# Patient Record
Sex: Male | Born: 1995 | Hispanic: No | Marital: Married | Smoking: Current every day smoker
Health system: Southern US, Community
[De-identification: ages and names within clinical notes are randomized; demographics above are authoritative.]

---
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
## 2015-06-16 ENCOUNTER — Other Ambulatory Visit: Payer: Self-pay | Admitting: Orthopedic Surgery

## 2015-06-16 ENCOUNTER — Encounter (HOSPITAL_BASED_OUTPATIENT_CLINIC_OR_DEPARTMENT_OTHER): Payer: Self-pay | Admitting: *Deleted

## 2015-06-21 ENCOUNTER — Ambulatory Visit (HOSPITAL_BASED_OUTPATIENT_CLINIC_OR_DEPARTMENT_OTHER): Admission: RE | Admit: 2015-06-21 | Payer: Medicaid Other | Source: Ambulatory Visit | Admitting: Orthopedic Surgery

## 2015-06-21 SURGERY — CYST REMOVAL
Anesthesia: Monitor Anesthesia Care | Site: Hand | Laterality: Right

## 2017-05-03 ENCOUNTER — Ambulatory Visit: Payer: Medicaid Other | Admitting: Podiatry

## 2017-05-25 ENCOUNTER — Ambulatory Visit: Payer: Medicaid Other | Admitting: Podiatry

## 2018-10-07 ENCOUNTER — Encounter (HOSPITAL_COMMUNITY): Payer: Self-pay

## 2018-10-07 ENCOUNTER — Emergency Department (HOSPITAL_COMMUNITY): Payer: No Typology Code available for payment source

## 2018-10-07 ENCOUNTER — Emergency Department (HOSPITAL_COMMUNITY)
Admission: EM | Admit: 2018-10-07 | Discharge: 2018-10-07 | Disposition: A | Discharge status: Home / Self Care | Payer: No Typology Code available for payment source | Attending: Emergency Medicine | Admitting: Emergency Medicine

## 2018-10-07 DIAGNOSIS — M545 Low back pain, unspecified: Secondary | ICD-10-CM

## 2018-10-07 DIAGNOSIS — Y9389 Activity, other specified: Secondary | ICD-10-CM | POA: Insufficient documentation

## 2018-10-07 DIAGNOSIS — R079 Chest pain, unspecified: Secondary | ICD-10-CM | POA: Diagnosis not present

## 2018-10-07 DIAGNOSIS — S0990XA Unspecified injury of head, initial encounter: Secondary | ICD-10-CM | POA: Diagnosis present

## 2018-10-07 DIAGNOSIS — S060X0A Concussion without loss of consciousness, initial encounter: Secondary | ICD-10-CM | POA: Diagnosis not present

## 2018-10-07 DIAGNOSIS — Y92410 Unspecified street and highway as the place of occurrence of the external cause: Secondary | ICD-10-CM | POA: Insufficient documentation

## 2018-10-07 DIAGNOSIS — M542 Cervicalgia: Secondary | ICD-10-CM | POA: Diagnosis not present

## 2018-10-07 DIAGNOSIS — M79601 Pain in right arm: Secondary | ICD-10-CM | POA: Insufficient documentation

## 2018-10-07 DIAGNOSIS — Y999 Unspecified external cause status: Secondary | ICD-10-CM | POA: Diagnosis not present

## 2018-10-07 DIAGNOSIS — F17290 Nicotine dependence, other tobacco product, uncomplicated: Secondary | ICD-10-CM | POA: Insufficient documentation

## 2018-10-07 MED ORDER — OXYCODONE-ACETAMINOPHEN 5-325 MG PO TABS
1.0000 | ORAL_TABLET | Freq: Once | ORAL | Status: AC
  Administered 2018-10-07: 1 via ORAL
  Filled 2018-10-07: qty 1

## 2018-10-07 MED ORDER — CYCLOBENZAPRINE HCL 10 MG PO TABS
5.0000 mg | ORAL_TABLET | Freq: Once | ORAL | Status: AC
  Administered 2018-10-07: 5 mg via ORAL
  Filled 2018-10-07: qty 1

## 2018-10-07 MED ORDER — METHOCARBAMOL 750 MG PO TABS: 750.0000 mg | ORAL_TABLET | Freq: Three times a day (TID) | ORAL | 0 refills | Status: DC | PRN
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
## 2018-10-07 NOTE — Discharge Instructions (Signed)
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
## 2020-02-18 ENCOUNTER — Other Ambulatory Visit: Payer: Self-pay

## 2020-02-18 ENCOUNTER — Ambulatory Visit (INDEPENDENT_AMBULATORY_CARE_PROVIDER_SITE_OTHER): Payer: Self-pay

## 2020-02-18 ENCOUNTER — Encounter (HOSPITAL_COMMUNITY): Payer: Self-pay

## 2020-02-18 ENCOUNTER — Ambulatory Visit (HOSPITAL_COMMUNITY)
Admission: EM | Admit: 2020-02-18 | Discharge: 2020-02-18 | Disposition: A | Discharge status: Home / Self Care | Payer: Self-pay

## 2020-02-18 DIAGNOSIS — R079 Chest pain, unspecified: Secondary | ICD-10-CM

## 2020-02-18 NOTE — ED Triage Notes (Signed)
Pt c/o 10/10 sharp non radiating left sided chest pain. Pt states it woke him up out of his sleep at 0300 today. Pt denies SOB, N/V. Pt has non labored breathing.

## 2020-02-18 NOTE — Discharge Instructions (Addendum)
This is most likely musculoskeletal chest pain. Your EKG was normal Your x-ray was normal Try doing some stretching, 600 ibuprofen every 8 hours for pain as needed Follow up as needed for continued or worsening symptoms

## 2020-02-20 NOTE — ED Provider Notes (Signed)
[facility]    CSN: [id number] Arrival date & time: 02/18/20  1134      History   Chief Complaint Chief Complaint  Patient presents with  . Chest Pain    HPI Gilbert Gilbert is a 24 y.o. male.   Patient is a 24 year old male presents today with left-sided chest pain.  Reporting woke him out of his sleep at 3:00 this a.m.  Symptoms have been intermittent since then.  Symptoms worse with palpation of the chest or certain movements.  No change with breathing.  No cough, chest congestion, fever, chills, body aches.  Denies any shortness of breath.  Denies any specific strenuous heavy lifting or injuries.  Describes the pain as sharp and stabbing when it comes.  No history of DVT or PE.  No recent traveling, surgery or prolonged sitting.  He does not smoke cigarettes but uses a vape.  ROS per HPI      History reviewed. No pertinent past medical history.  There are no problems to display for this patient.   History reviewed. No pertinent surgical history.     Home Medications    Prior to Admission medications   Medication Sig Start Date End Date Taking? Authorizing Provider  Multiple Vitamin (MULTIVITAMIN WITH MINERALS) TABS tablet Take 1 tablet by mouth daily.   Yes [provider]  Omega-3 Fatty Acids (FISH OIL) 1000 MG CAPS Take by mouth.   Yes [provider]    Family History Family History  Problem Relation Age of Onset  . Diabetes Mother   . Hypertension Mother   . Hypertension Father     Social History Social History   Tobacco Use  . Smoking status: Current Every Day Smoker  . Smokeless tobacco: Never Used  . Tobacco comment: Smokes Hookah   Substance Use Topics  . Alcohol use: No  . Drug use: No     Allergies   Patient has no known allergies.   Review of Systems Review of Systems   Physical Exam Triage Vital Signs ED Triage Vitals  Enc Vitals Group     BP 02/18/20 1150 117/75     Pulse Rate 02/18/20 1150 70    Resp 02/18/20 1150 16     Temp 02/18/20 1150 (!) 97.5 F (36.4 C)     Temp Source 02/18/20 1150 Oral     SpO2 02/18/20 1150 97 %     Weight 02/18/20 1151 216 lb (98 kg)     Height 02/18/20 1151 6\' 2"  (1.88 m)     Head Circumference --      Peak Flow --      Pain Score 02/18/20 1151 10     Pain Loc --      Pain Edu? --      Excl. in GC? --    No data found.  Updated Vital Signs BP 117/75   Pulse 70   Temp (!) 97.5 F (36.4 C) (Oral)   Resp 16   Ht 6\' 2"  (1.88 m)   Wt 216 lb (98 kg)   SpO2 97%   BMI 27.73 kg/m   Visual Acuity Right Eye Distance:   Left Eye Distance:   Bilateral Distance:    Right Eye Near:   Left Eye Near:    Bilateral Near:     Physical Exam Vitals and nursing note reviewed.  Constitutional:      General: He is not in acute distress.    Appearance: Normal appearance. He  is not ill-appearing, toxic-appearing or diaphoretic.  HENT:     Head: Normocephalic and atraumatic.     Nose: Nose normal.  Eyes:     Conjunctiva/sclera: Conjunctivae normal.  Cardiovascular:     Rate and Rhythm: Normal rate and regular rhythm.  Pulmonary:     Effort: Pulmonary effort is normal.     Breath sounds: Normal breath sounds.  Abdominal:     Palpations: Abdomen is soft.     Tenderness: There is no abdominal tenderness.  Musculoskeletal:        General: Normal range of motion.     Cervical back: Normal range of motion.  Skin:    General: Skin is warm and dry.  Neurological:     Mental Status: He is alert.  Psychiatric:        Mood and Affect: Mood normal.      UC Treatments / Results  Labs (all labs ordered are listed, but only abnormal results are displayed) Labs Reviewed - No data to display  EKG   Radiology No results found.  Procedures Procedures (including critical care time)  Medications Ordered in UC Medications - No data to display  Initial Impression / Assessment and Plan / UC Course  I have reviewed the triage vital signs and the  nursing notes.  Pertinent labs & imaging results that were available during my care of the patient were reviewed by me and considered in my medical decision making (see chart for details).     Chest pain-EKG with normal sinus rhythm and normal rate.  X-ray without any acute findings. We will treat this as musculoskeletal chest wall pain. Recommended doing stretching, 600 of ibuprofen every 8 hours Return precautions and ER precautions given. Final Clinical Impressions(s) / UC Diagnoses   Final diagnoses:  Chest pain, unspecified type     Discharge Instructions     This is most likely musculoskeletal chest pain. Your EKG was normal Your x-ray was normal Try doing some stretching, 600 ibuprofen every 8 hours for pain as needed Follow up as needed for continued or worsening symptoms     ED Prescriptions    None     PDMP not reviewed this encounter.   Loura Halt A, NP 02/20/20 1306

## 2021-11-09 ENCOUNTER — Ambulatory Visit: Payer: Medicaid Other | Admitting: Nurse Practitioner

## 2022-04-06 ENCOUNTER — Ambulatory Visit (INDEPENDENT_AMBULATORY_CARE_PROVIDER_SITE_OTHER): Payer: Self-pay | Admitting: Podiatry

## 2022-04-06 ENCOUNTER — Ambulatory Visit (INDEPENDENT_AMBULATORY_CARE_PROVIDER_SITE_OTHER): Payer: Self-pay

## 2022-04-06 ENCOUNTER — Encounter: Payer: Self-pay | Admitting: Podiatry

## 2022-04-06 DIAGNOSIS — M79671 Pain in right foot: Secondary | ICD-10-CM

## 2022-04-06 DIAGNOSIS — M76821 Posterior tibial tendinitis, right leg: Secondary | ICD-10-CM

## 2022-04-06 MED ORDER — MELOXICAM 15 MG PO TABS: 15.0000 mg | ORAL_TABLET | Freq: Every day | ORAL | 0 refills | Status: AC

## 2022-04-06 NOTE — Patient Instructions (Signed)
Posterior Tibial Tendinitis Rehab Ask your health care provider which exercises are safe for you. Do exercises exactly as told by your health care provider and adjust them as directed. It is normal to feel mild stretching, pulling, tightness, or discomfort as you do these exercises. Stop right away if you feel sudden pain or your pain gets worse. Do not begin these exercises until told by your health care provider. Stretching and range-of-motion exercises These exercises warm up your muscles and joints and improve the movement and flexibility in your ankle and foot. These exercises may also help to relieve pain. Standing wall calf stretch, knee straight  Stand with your hands against a wall. Extend your left / right leg behind you, and bend your front knee slightly. If directed, place a folded washcloth under the arch of your foot for support. Point the toes of your back foot slightly inward. Keeping your heels on the floor and your back knee straight, shift your weight toward the wall. Do not allow your back to arch. You should feel a gentle stretch in your upper left / right calf. Hold this position for __________ seconds. Repeat __________ times. Complete this exercise __________ times a day. Standing wall calf stretch, knee bent Stand with your hands against a wall. Extend your left / right leg behind you, and bend your front knee slightly. If directed, place a folded washcloth under the arch of your foot for support. Point the toes of your back foot slightly inward. Unlock your back knee so it is bent. Keep your heels on the floor. You should feel a gentle stretch deep in your lower left / right calf. Hold this position for __________ seconds. Repeat __________ times. Complete this exercise __________ times a day. Strengthening exercises These exercises build strength and endurance in your ankle and foot. Endurance is the ability to use your muscles for a long time, even after they get  tired. Ankle inversion with band Secure one end of a rubber exercise band or tubing to a fixed object, such as a table leg or a pole, that will stay still when the band is pulled. Loop the other end of the band around the middle of your left / right foot. Sit on the floor facing the object with your left / right leg extended. The band or tube should be slightly tense when your foot is relaxed. Leading with your big toe, slowly bring your left / right foot and ankle inward, toward your other foot (inversion). Hold this position for __________ seconds. Slowly return your foot to the starting position. Repeat __________ times. Complete this exercise __________ times a day. Towel curls  Sit in a chair on a non-carpeted surface, and put your feet on the floor. Place a towel in front of your feet. If told by your health care provider, add a __________ weight to the end of the towel. Keeping your heel on the floor, put your left / right foot on the towel. Pull the towel toward you by grabbing the towel with your toes and curling them under. Keep your heel on the floor while you do this. Let your toes relax. Grab the towel with your toes again. Keep going until the towel is completely underneath your foot. Repeat __________ times. Complete this exercise __________ times a day. Balance exercise This exercise improves or maintains your balance. Balance is important in preventing falls. Single leg stand Without wearing shoes, stand near a railing or in a doorway. You may hold   on to the railing or door frame as needed for balance. Stand on your left / right foot. Keep your big toe down on the floor and try to keep your arch lifted. If balancing in this position is too easy, try the exercise with your eyes closed or while standing on a pillow. Hold this position for __________ seconds. Repeat __________ times. Complete this exercise __________ times a day. This information is not intended to replace  advice given to you by your health care provider. Make sure you discuss any questions you have with your health care provider. Document Revised: 02/25/2019 Document Reviewed: 01/01/2019 Elsevier Patient Education  2023 [employer].  

## 2022-04-06 NOTE — Progress Notes (Signed)
  Subjective:  Patient ID: Gilbert Gilbert, male    DOB: [birth year],   MRN: [id number]  Chief Complaint  Patient presents with   Foot Pain                                                                                                                                                                                                                                                            26 y.o. male presents for concern of right foot pain that has been going on for four weeks. Relates it hurts on the left side of his right foot and goes up into the ankle. Relates   shooting pain when walking. Relates resting is the only thing that has helped. Denies any other pedal complaints. Denies n/v/f/c.   No past medical history on file.  Objective:  Physical Exam: Vascular: DP/PT pulses 2/4 bilateral. CFT <3 seconds. Normal hair growth on digits. No edema.  Skin. No lacerations or abrasions bilateral feet.  Musculoskeletal: MMT 5/5 bilateral lower extremities in DF, PF, Inversion and Eversion. Deceased ROM in DF of ankle joint. Tender along insertion of PT tendon and proximally to medial malleolus. No pain around achilles or medial calcaneal tubercle. Pain with inversion and plantarflexion of the foot. Unable to do single heel rise.  Neurological: Sensation intact to light touch.   Assessment:   1. Posterior tibial tendon dysfunction (PTTD) of right lower extremity      Plan:  Patient was evaluated and treated and all questions answered. X-rays reviewed and discussed with patient. No acute fractures or dislocations noted.   Discussed PTTD diagnosis and treatment options with patient. Stretching exercises discussed and handout dispensed. Prescription for meloxicam provided. Tri-Lock ankle brace dispensed. Discussed if there is no improvement PT/MRI/injection may be an option. Patient to return to clinic in 6 to 8 weeks or sooner if symptoms fail to improve or worsen.   Louann Sjogren, DPM

## 2022-05-25 ENCOUNTER — Ambulatory Visit (INDEPENDENT_AMBULATORY_CARE_PROVIDER_SITE_OTHER): Payer: Self-pay | Admitting: Podiatry

## 2022-05-25 DIAGNOSIS — Z91199 Patient's noncompliance with other medical treatment and regimen due to unspecified reason: Secondary | ICD-10-CM

## 2022-05-25 NOTE — Progress Notes (Signed)
No show

## 2022-10-13 DIAGNOSIS — Z419 Encounter for procedure for purposes other than remedying health state, unspecified: Secondary | ICD-10-CM | POA: Diagnosis not present

## 2022-11-13 DIAGNOSIS — Z419 Encounter for procedure for purposes other than remedying health state, unspecified: Secondary | ICD-10-CM | POA: Diagnosis not present

## 2022-12-14 DIAGNOSIS — Z419 Encounter for procedure for purposes other than remedying health state, unspecified: Secondary | ICD-10-CM | POA: Diagnosis not present

## 2023-01-12 DIAGNOSIS — Z419 Encounter for procedure for purposes other than remedying health state, unspecified: Secondary | ICD-10-CM | POA: Diagnosis not present

## 2023-02-12 DIAGNOSIS — Z419 Encounter for procedure for purposes other than remedying health state, unspecified: Secondary | ICD-10-CM | POA: Diagnosis not present

## 2023-03-14 DIAGNOSIS — Z419 Encounter for procedure for purposes other than remedying health state, unspecified: Secondary | ICD-10-CM | POA: Diagnosis not present

## 2023-03-26 IMAGING — DX DG FOOT COMPLETE 3+V*R*
3 series · 3 of 3 positions shown · non-contrast
Comparison: None Available.

CLINICAL DATA: Foot pain.

EXAM:
RIGHT FOOT COMPLETE - 3+ VIEW

[foot ap wb]
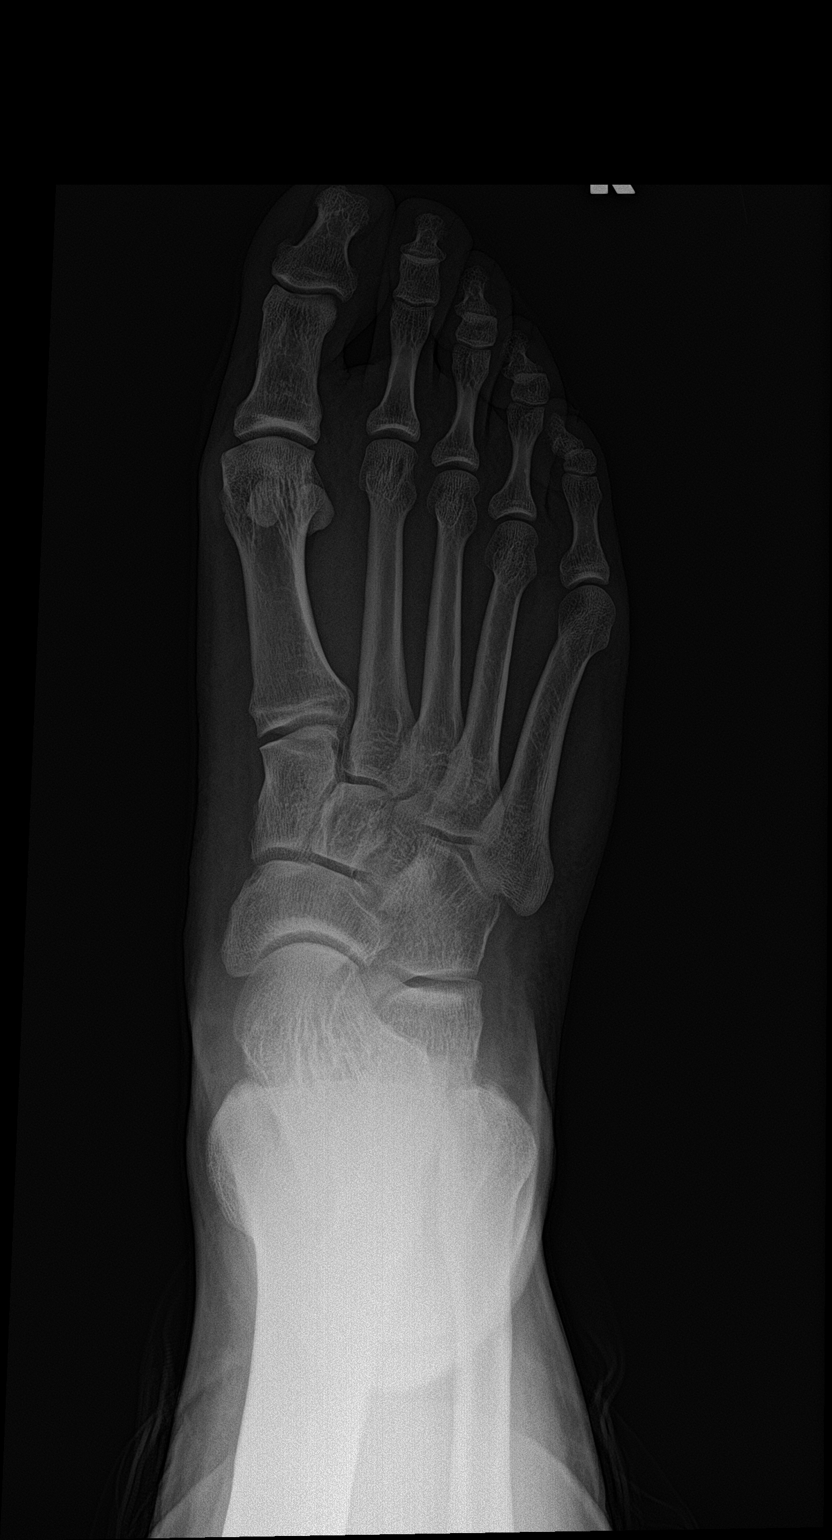

[foot obl wb]
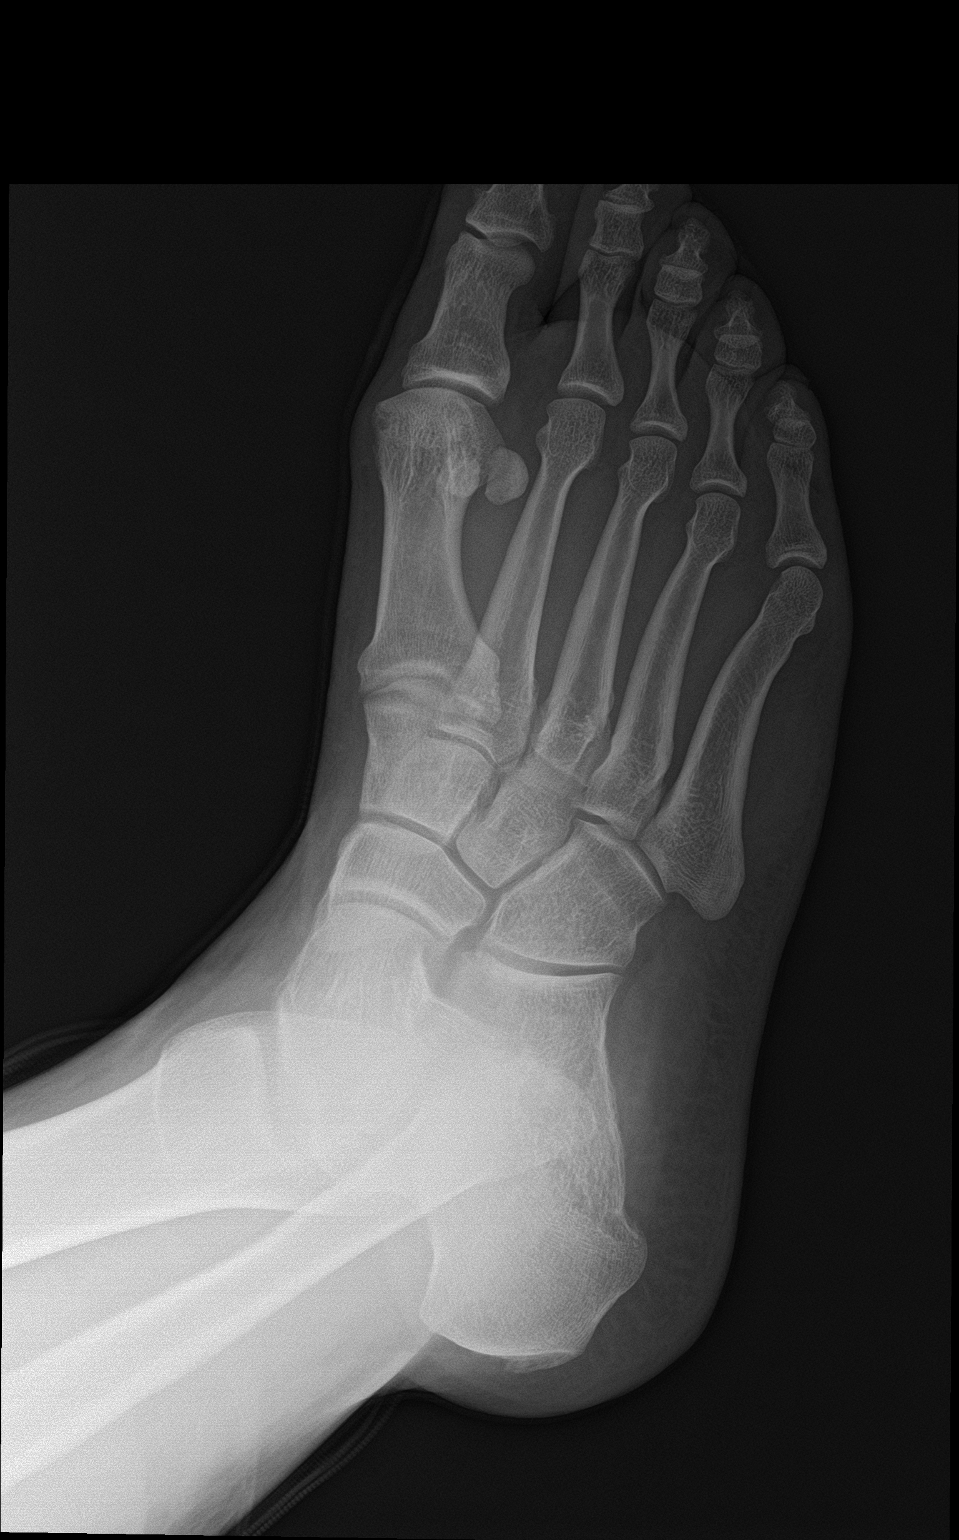

[foot lat wb]
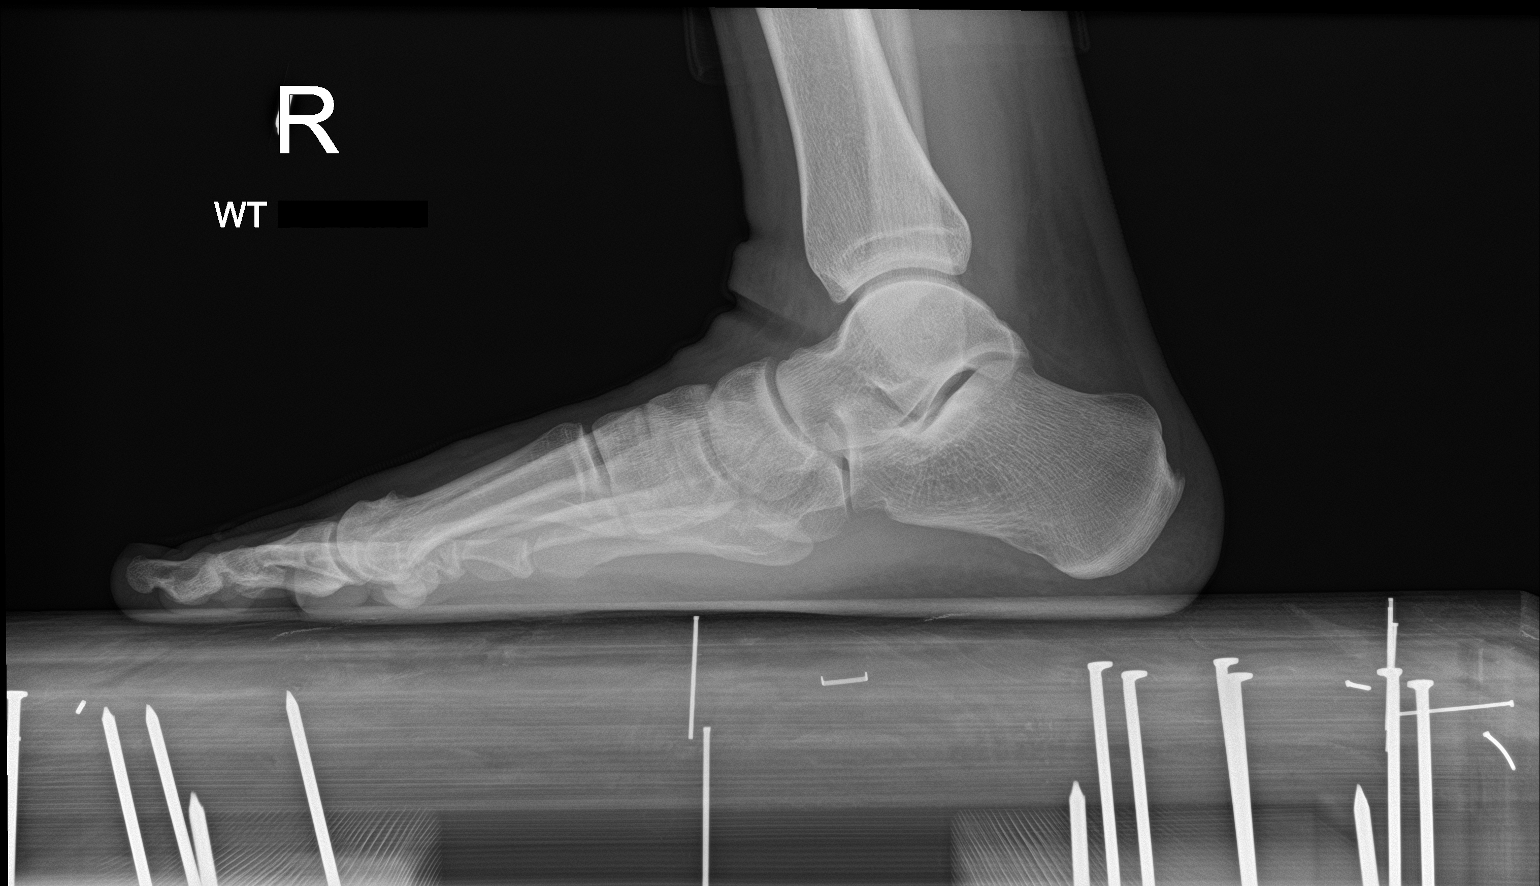

[3 of 3 positions shown; findings below may reference images not displayed]

FINDINGS: There is no evidence of fracture or dislocation. There is no
evidence of arthropathy or other focal bone abnormality. Soft
tissues are unremarkable.
IMPRESSION: Negative.

## 2023-04-14 DIAGNOSIS — Z419 Encounter for procedure for purposes other than remedying health state, unspecified: Secondary | ICD-10-CM | POA: Diagnosis not present

## 2023-05-14 DIAGNOSIS — Z419 Encounter for procedure for purposes other than remedying health state, unspecified: Secondary | ICD-10-CM | POA: Diagnosis not present

## 2023-06-14 DIAGNOSIS — Z419 Encounter for procedure for purposes other than remedying health state, unspecified: Secondary | ICD-10-CM | POA: Diagnosis not present

## 2023-07-15 DIAGNOSIS — Z419 Encounter for procedure for purposes other than remedying health state, unspecified: Secondary | ICD-10-CM | POA: Diagnosis not present

## 2023-08-14 DIAGNOSIS — Z419 Encounter for procedure for purposes other than remedying health state, unspecified: Secondary | ICD-10-CM | POA: Diagnosis not present

## 2023-09-14 DIAGNOSIS — Z419 Encounter for procedure for purposes other than remedying health state, unspecified: Secondary | ICD-10-CM | POA: Diagnosis not present

## 2023-10-14 DIAGNOSIS — Z419 Encounter for procedure for purposes other than remedying health state, unspecified: Secondary | ICD-10-CM | POA: Diagnosis not present

## 2023-11-04 ENCOUNTER — Encounter (HOSPITAL_COMMUNITY): Payer: Self-pay

## 2023-11-04 ENCOUNTER — Emergency Department (HOSPITAL_COMMUNITY): Payer: Medicaid Other

## 2023-11-04 ENCOUNTER — Emergency Department (HOSPITAL_COMMUNITY)
Admission: EM | Admit: 2023-11-04 | Discharge: 2023-11-04 | Disposition: A | Discharge status: Home / Self Care | Payer: Medicaid Other | Attending: Emergency Medicine | Admitting: Emergency Medicine

## 2023-11-04 ENCOUNTER — Other Ambulatory Visit: Payer: Self-pay

## 2023-11-04 DIAGNOSIS — R0602 Shortness of breath: Secondary | ICD-10-CM | POA: Diagnosis not present

## 2023-11-04 DIAGNOSIS — R0789 Other chest pain: Secondary | ICD-10-CM | POA: Insufficient documentation

## 2023-11-04 DIAGNOSIS — R002 Palpitations: Secondary | ICD-10-CM | POA: Diagnosis not present

## 2023-11-04 LAB — BASIC METABOLIC PANEL
Anion gap: 6 (ref 5–15)
BUN: 22 mg/dL — ABNORMAL HIGH (ref 6–20)
CO2: 24 mmol/L (ref 22–32)
Calcium: 8.7 mg/dL — ABNORMAL LOW (ref 8.9–10.3)
Chloride: 102 mmol/L (ref 98–111)
Creatinine, Ser: 0.86 mg/dL (ref 0.61–1.24)
GFR, Estimated: >60 mL/min (ref >60)
Glucose, Bld: 92 mg/dL (ref 70–99)
Potassium: 3.6 mmol/L (ref 3.5–5.1)
Sodium: 132 mmol/L — ABNORMAL LOW (ref 135–145)

## 2023-11-04 LAB — CBC
HCT: 44.6 % (ref 39.0–52.0)
Hemoglobin: 14.9 g/dL (ref 13.0–17.0)
MCH: 29.2 pg (ref 26.0–34.0)
MCHC: 33.4 g/dL (ref 30.0–36.0)
MCV: 87.5 fL (ref 80.0–100.0)
Platelets: 169 10*3/uL (ref 150–400)
RBC: 5.1 MIL/uL (ref 4.22–5.81)
RDW: 11.8 % (ref 11.5–15.5)
WBC: 6.7 10*3/uL (ref 4.0–10.5)
nRBC: 0 % (ref 0.0–0.2)

## 2023-11-04 LAB — TROPONIN I (HIGH SENSITIVITY): Troponin I (High Sensitivity): 4 ng/L (ref <18)

## 2023-11-04 NOTE — ED Provider Notes (Signed)
[place] [facility] AT [facility] Provider Note   CSN: [id number] Arrival date & time: 11/04/23  1604     History  Chief Complaint  Patient presents with   Shortness of Breath   Headache   Fatigue    Gilbert Gilbert is a 27 y.o. male.  HPI    27 year old male comes in with chief complaint of chest discomfort and shortness of breath.  He states that he had fallen asleep in his couch yesterday.  He woke up around 1:30 AM and went to the bathroom.  While brushing his teeth, he suddenly had severe shortness of breath and chest tightness that lasted about a minute.  He felt like his " soul was leaving him".  He had some palpitations. Thereafter he felt well and proceeded to go to bed.  After waking up this morning he is just felt drowsy and weak and he has experienced a little bit of a headache.  Currently he does not have any chest pain, shortness of breath or severe headache.  He denies any URI-like symptoms.  There is family history of CAD, but no premature deaths. Pt has no hx of PE, DVT and denies any exogenous hormone (testosterone / estrogen) use, long distance travels or surgery in the past 6 weeks, active cancer, recent immobilization. Patient denies any substance use disorder.  Home Medications Prior to Admission medications   Medication Sig Start Date End Date Taking? Authorizing Provider  meloxicam (MOBIC) 15 MG tablet Take 1 tablet (15 mg total) by mouth daily. 04/06/22   Louann Sjogren, DPM  Multiple Vitamin (MULTIVITAMIN WITH MINERALS) TABS tablet Take 1 tablet by mouth daily.    [provider]  Omega-3 Fatty Acids (FISH OIL) 1000 MG CAPS Take by mouth.    [provider]      Allergies    Patient has no known allergies.    Review of Systems   Review of Systems  All other systems reviewed and are negative.   Physical Exam Updated Vital Signs BP 128/83 (BP Location: Left Arm)   Pulse 65   Temp 98.3 F (36.8 C) (Oral)    Resp 17   Ht 6\' 2"  (1.88 m)   Wt 108.9 kg   SpO2 98%   BMI 30.81 kg/m  Physical Exam Vitals and nursing note reviewed.  Constitutional:      Appearance: He is well-developed.  HENT:     Head: Atraumatic.  Cardiovascular:     Rate and Rhythm: Normal rate.  Pulmonary:     Effort: Pulmonary effort is normal.     Breath sounds: No decreased breath sounds, wheezing, rhonchi or rales.  Musculoskeletal:     Cervical back: Neck supple.  Skin:    General: Skin is warm.  Neurological:     Mental Status: He is alert and oriented to person, place, and time.     ED Results / Procedures / Treatments   Labs (all labs ordered are listed, but only abnormal results are displayed) Labs Reviewed  BASIC METABOLIC PANEL - Abnormal; Notable for the following components:      Result Value   Sodium 132 (*)    BUN 22 (*)    Calcium 8.7 (*)    All other components within normal limits  CBC  TROPONIN I (HIGH SENSITIVITY)  TROPONIN I (HIGH SENSITIVITY)    EKG EKG Interpretation Date/Time:  Sunday November 04 2023 17:37:52 EST Ventricular Rate:  62 PR Interval:  290 QRS  Duration:  99 QT Interval:  430 QTC Calculation: 437 R Axis:   24  Text Interpretation: Sinus rhythm Prolonged PR interval nonspecific conduction irregularity noted No significant change since last tracing Confirmed by Derwood Kaplan [phone]) on 11/04/2023 6:23:13 PM  Radiology DG Chest 2 View Result Date: 11/04/2023 CLINICAL DATA:  Shortness of breath. EXAM: CHEST - 2 VIEW COMPARISON:  02/18/2020 FINDINGS: The lungs are clear without focal pneumonia, edema, pneumothorax or pleural effusion. The cardiopericardial silhouette is within normal limits for size. No acute bony abnormality. Telemetry leads overlie the chest. IMPRESSION: No active cardiopulmonary disease. Electronically Signed   By: Kennith Center M.D.   On: 11/04/2023 16:36    Procedures Procedures    Medications Ordered in ED Medications - No data to  display  ED Course/ Medical Decision Making/ A&P                                 Medical Decision Making Amount and/or Complexity of Data Reviewed Labs: ordered. Radiology: ordered.   This patient presents to the ED with chief complaint(s) of shortness of breath, chest tightness with no pertinent past medical history.The complaint involves an extensive differential diagnosis and also carries with it a high risk of complications and morbidity.    The differential diagnosis includes: PSVT, AF, PE, electrolyte abnormality  The initial plan is to get basic labs, tele monitoring and single troponin. No uri like symptoms.    Independent labs interpretation:  The following labs were independently interpreted: No evidence of anemia, severe electrolyte abnormality.  EKG also looks to be at baseline.  Independent visualization and interpretation of imaging: - I independently visualized the following imaging with scope of interpretation limited to determining acute life threatening conditions related to emergency care: X-ray of the chest, which revealed no evidence of pneumothorax  Treatment and Reassessment: Initial workup is reassuring.  Plan is to discharge patient with recommendation of journaling similar events and to return to the ER if he starts having severe chest pain, near fainting or fainting. Additionally, we have given him cardiology contact number.  If he has recurrent episodes of chest pain that are lasting just for few minutes along with palpitations and near fainting then he can call them for outpatient workup.   Final Clinical Impression(s) / ED Diagnoses Final diagnoses:  Chest tightness  Palpitations    Rx / DC Orders ED Discharge Orders     None         Derwood Kaplan, MD 11/04/23 1844

## 2023-11-04 NOTE — ED Triage Notes (Signed)
Pt arrives via POV. Pt reports last night around 0130 he felt a sudden onset of sob that last about 1 minute. He then went sleep. He states after waking up this morning he has had a headache and some fatigue. Pt currently denies cp or sob. Pt AxOx4. NAD

## 2023-11-04 NOTE — Discharge Instructions (Addendum)
We saw you in the ER for the chest pain/shortness of breath. All of our cardiac workup is normal, including labs, EKG and chest X-RAY are normal. We are not sure what is causing your discomfort, but we feel comfortable sending you home at this time. The workup in the ER is not complete, and you should follow up with your primary care doctor for further evaluation.  We have provided you with phone number for the cardiology group.  If you continue to have episodes of chest discomfort or near fainting or palpitations, please reach out to them.

## 2023-11-14 DIAGNOSIS — Z419 Encounter for procedure for purposes other than remedying health state, unspecified: Secondary | ICD-10-CM | POA: Diagnosis not present

## 2023-12-15 DIAGNOSIS — Z419 Encounter for procedure for purposes other than remedying health state, unspecified: Secondary | ICD-10-CM | POA: Diagnosis not present

## 2024-01-12 DIAGNOSIS — Z419 Encounter for procedure for purposes other than remedying health state, unspecified: Secondary | ICD-10-CM | POA: Diagnosis not present

## 2024-02-23 DIAGNOSIS — Z419 Encounter for procedure for purposes other than remedying health state, unspecified: Secondary | ICD-10-CM | POA: Diagnosis not present

## 2024-03-24 DIAGNOSIS — Z419 Encounter for procedure for purposes other than remedying health state, unspecified: Secondary | ICD-10-CM | POA: Diagnosis not present

## 2024-04-24 DIAGNOSIS — Z419 Encounter for procedure for purposes other than remedying health state, unspecified: Secondary | ICD-10-CM | POA: Diagnosis not present

## 2024-05-24 DIAGNOSIS — Z419 Encounter for procedure for purposes other than remedying health state, unspecified: Secondary | ICD-10-CM | POA: Diagnosis not present

## 2024-06-24 DIAGNOSIS — Z419 Encounter for procedure for purposes other than remedying health state, unspecified: Secondary | ICD-10-CM | POA: Diagnosis not present

## 2024-07-25 DIAGNOSIS — Z419 Encounter for procedure for purposes other than remedying health state, unspecified: Secondary | ICD-10-CM | POA: Diagnosis not present

## 2024-09-15 ENCOUNTER — Other Ambulatory Visit: Payer: Self-pay

## 2024-09-15 ENCOUNTER — Encounter (HOSPITAL_BASED_OUTPATIENT_CLINIC_OR_DEPARTMENT_OTHER): Payer: Self-pay | Admitting: Emergency Medicine

## 2024-09-15 ENCOUNTER — Emergency Department (HOSPITAL_BASED_OUTPATIENT_CLINIC_OR_DEPARTMENT_OTHER)
Admission: EM | Admit: 2024-09-15 | Discharge: 2024-09-15 | Disposition: A | Discharge status: Home / Self Care | Attending: Emergency Medicine | Admitting: Emergency Medicine

## 2024-09-15 DIAGNOSIS — Y990 Civilian activity done for income or pay: Secondary | ICD-10-CM | POA: Diagnosis not present

## 2024-09-15 DIAGNOSIS — R1032 Left lower quadrant pain: Secondary | ICD-10-CM | POA: Insufficient documentation

## 2024-09-15 DIAGNOSIS — X500XXA Overexertion from strenuous movement or load, initial encounter: Secondary | ICD-10-CM | POA: Diagnosis not present

## 2024-09-15 DIAGNOSIS — F17200 Nicotine dependence, unspecified, uncomplicated: Secondary | ICD-10-CM | POA: Diagnosis not present

## 2024-09-15 NOTE — Discharge Instructions (Addendum)
 It was a pleasure caring for you today in the emergency department.  Please avoid heavy lifting the next few days to see if it improves your symptoms. You can take Tylenol  or Ibuprofen  as needed for discomfort. You can also follow up with [facility] for further hernia evaluation.   Please return to the emergency department for any worsening or worrisome symptoms.

## 2024-09-15 NOTE — ED Provider Notes (Signed)
 [name] [facility] AT [facility] Provider Note  CSN: [id number] Arrival date & time: 09/15/24 1811  Chief Complaint(s) Abdominal Pain  HPI Gilbert Gilbert is a 28 y.o. male with no past medical history who presents to the ED with complaint of L inguinal tension.  Intermittent tension and wave-like sensation x 3 days. No inciting trigger. Does not describe it as pain. Feels like there is more fullness in the area compared to the left. Lifts 50+ pounds for exercise and occasionally at work. Denies abdominal pain, dysuria or penial discharge. Denies scrotal pain or fullness.  Has appointment to establish with PCP on 12/5, other does not seek medical care.  Past Medical History History reviewed. No pertinent past medical history. There are no active problems to display for this patient.  Home Medication(s) Prior to Admission medications   Medication Sig Start Date End Date Taking? Authorizing Provider  meloxicam  (MOBIC ) 15 MG tablet Take 1 tablet (15 mg total) by mouth daily. 04/06/22   Sikora, Rebecca, DPM  Multiple Vitamin (MULTIVITAMIN WITH MINERALS) TABS tablet Take 1 tablet by mouth daily.    [provider]  Omega-3 Fatty Acids (FISH OIL) 1000 MG CAPS Take by mouth.    [provider]                                                                                                                                    Past Surgical History History reviewed. No pertinent surgical history. Family History Family History  Problem Relation Age of Onset   Diabetes Mother    Hypertension Mother    Hypertension Father     Social History Social History   Tobacco Use   Smoking status: Every Day   Smokeless tobacco: Never   Tobacco comments:    Smokes Hookah   Substance Use Topics   Alcohol use: No   Drug use: No   Allergies Carrot flavor [flavoring agent (non-screening)]  Review of Systems A thorough review of systems was obtained and  all systems are negative except as noted in the HPI and PMH.   Physical Exam Vital Signs  I have reviewed the triage vital signs BP 123/69   Pulse 64   Temp 98.3 F (36.8 C) (Oral)   Resp 17   Ht 6' 2 (1.88 m)   Wt 106.6 kg   SpO2 97%   BMI 30.17 kg/m  Physical Exam General: Well-appearing. Alert. NAD Pulm: Normal WOB on RA Abdomen: Soft, non-tender, non-distended. +BS GU: Minimal fullness in L upper inguinal region when compared to R. Mild tenderness with deep palpation.No L inguinal hernia noted. Skin: Warm, dry. No rashes noted   ED Results and Treatments Labs (all labs ordered are listed, but only abnormal results are displayed) Labs Reviewed - No data to display  Radiology No results found.  Pertinent labs & imaging results that were available during my care of the patient were reviewed by me and considered in my medical decision making (see MDM for details).  Medications Ordered in ED Medications - No data to display                                                                 Medical Decision Making / ED Course  Medical Decision Making:    Gilbert Gilbert is a 28 y.o. male without PMHx presents with L inguinal tenderness. The complaint involves an extensive differential diagnosis and also carries with it a high risk of complications and morbidity.  Serious etiology was considered. Ddx includes but is not limited to: incarcerated hernia, constipation, STI,   Complete initial physical exam performed, notably the patient was in no distress.    Reviewed and confirmed nursing documentation for past medical history, family history, social history.  Vital signs reviewed.    28 year old male without significant past medical history presenting with L inguinal tenderness x 3 days. No discernable hernia upon exam, possibly MSK strain vs reactive lymph node.  Offered STI testing, patient declined. Advised to follow up with PCP and General Surgery for continued evaluation. ED return precautions discussed.     Reevaluation: After the interventions noted above, I reevaluated the patient and found that they have improved  Co morbidities that complicate the patient evaluation History reviewed. No pertinent past medical history.    Dispostion: Disposition decision including need for hospitalization was considered, and patient discharged from emergency department.    Final Clinical Impression(s) / ED Diagnoses Final diagnoses:  Left lower quadrant abdominal pain        Theophilus Pagan, MD 09/15/24 2119    Emil Share, DO 09/15/24 2130

## 2024-09-15 NOTE — ED Triage Notes (Signed)
 Pt c/o LLQ abd pain pulsing, intermittent,  x 1 day.  Today while at work felt a bulging in L inguinal area.   LBM today.

## 2024-10-17 ENCOUNTER — Ambulatory Visit: Admitting: Family Medicine

## 2024-10-17 ENCOUNTER — Encounter: Payer: Self-pay | Admitting: Family Medicine

## 2024-10-17 ENCOUNTER — Ambulatory Visit: Payer: Self-pay | Admitting: Family Medicine

## 2024-10-17 VITALS — BP 122/68 | HR 65 | Temp 98.2°F | Ht 74.0 in | Wt 242.8 lb

## 2024-10-17 DIAGNOSIS — Z0000 Encounter for general adult medical examination without abnormal findings: Secondary | ICD-10-CM

## 2024-10-17 DIAGNOSIS — Z13220 Encounter for screening for lipoid disorders: Secondary | ICD-10-CM | POA: Diagnosis not present

## 2024-10-17 DIAGNOSIS — Z23 Encounter for immunization: Secondary | ICD-10-CM

## 2024-10-17 DIAGNOSIS — M2391 Unspecified internal derangement of right knee: Secondary | ICD-10-CM | POA: Diagnosis not present

## 2024-10-17 LAB — LIPID PANEL
Cholesterol: 192 mg/dL (ref 0–200)
HDL: 37.9 mg/dL — ABNORMAL LOW (ref >39.00)
LDL Cholesterol: 141 mg/dL — ABNORMAL HIGH (ref 0–99)
NonHDL: 153.95
Total CHOL/HDL Ratio: 5
Triglycerides: 65 mg/dL (ref 0.0–149.0)
VLDL: 13 mg/dL (ref 0.0–40.0)

## 2024-10-17 NOTE — Assessment & Plan Note (Signed)
 History and exam consistent with a prior meniscal tear. Currently this is not very bothersome. Recommend expectant management for now.

## 2024-10-17 NOTE — Progress Notes (Signed)
 [facility] [address] [place] [address] Dept: [phone] Dept Fax: [phone]  New Patient Office Visit  Subjective:    Patient ID: Gilbert Gilbert, male    DOB: Mar 01, 1996, 28 y.o..   MRN: [id number]  Chief Complaint  Patient presents with   Establish Care    NP- establish care.  No concerns.   Declines flu shot.   History of Present Illness:  Patient is in today to establish care. Mr. Gilbert Gilbert was born in [place]. He attended [employer] for 18 months, studying actuary, but did not finish a degree. 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 Mr. Gilbert Gilbert has been married for ~ 18 months. He has a 46 month-old daughter. He admits to smoking an occasional cigarette (less than 1 a day). He denies any alcohol or drug use.  Review of Systems  Constitutional:  Negative for chills, diaphoresis, fever, malaise/fatigue and weight loss.  HENT:  Negative for congestion, ear pain, hearing loss, sinus pain, sore throat and tinnitus.   Eyes:  Negative for blurred vision, pain, discharge and redness.  Respiratory:  Negative for cough, shortness of breath and wheezing.   Cardiovascular:  Negative for chest pain and palpitations.  Gastrointestinal:  Negative for abdominal pain, constipation, diarrhea, heartburn, nausea and vomiting.  Musculoskeletal:  Positive for joint pain. Negative for back pain and myalgias.       Has popping in his right knee. Recall an injury several years ago, when he had an audible pop in his right knee while doing squats. He then had his knee swell up for several days. He denies any locking or sudden giving away of the knee.  Skin:  Negative for itching and rash.  Psychiatric/Behavioral:  Negative for depression. The patient is not nervous/anxious.    Past Medical History: There are no active problems to display for this patient.  No past surgical history on file. Family History   Problem Relation Age of Onset   Diabetes Mother    Hypertension Mother    Hypertension Father    Outpatient Medications Prior to Visit  Medication Sig Dispense Refill   meloxicam  (MOBIC ) 15 MG tablet Take 1 tablet (15 mg total) by mouth daily. 30 tablet 0   Multiple Vitamin (MULTIVITAMIN WITH MINERALS) TABS tablet Take 1 tablet by mouth daily.     Omega-3 Fatty Acids (FISH OIL) 1000 MG CAPS Take by mouth.     No facility-administered medications prior to visit.   Allergies  Allergen Reactions   Carrot Flavor [Flavoring Agent (Non-Screening)]     Itchy throat   Objective:   Today's Vitals   10/17/24 1017  BP: 122/68  Pulse: 65  Temp: 98.2 F (36.8 C)  TempSrc: Temporal  SpO2: 97%  Weight: 242 lb 12.8 oz (110.1 kg)  Height: 6' 2 (1.88 m)   Body mass index is 31.17 kg/m.   General: Well developed, well nourished. No acute distress. Lungs: Clear to auscultation bilaterally. No wheezing, rales or rhonchi. CV: RRR without murmurs or rubs. Pulses 2+ bilaterally. Abdomen: Soft, non-tender. Bowel sounds positive, normal pitch and frequency. No hepatosplenomegaly. No rebound or   guarding. Extremities: Full ROM. No joint swelling or tenderness. Right knee does exhibit some popping with extension/flexion   lateral to the patellar tendon along the joint line.  No edema noted. Skin: Warm and dry. No rashes. Psych: Alert and oriented. Normal mood and affect.  Health Maintenance Due  Topic Date  Due   HIV Screening  Never done   Hepatitis C Screening  Never done   Pneumococcal Vaccine (1 of 2 - PCV) Never done   Hepatitis B Vaccines 19-59 Average Risk (1 of 3 - 19+ 3-dose series) Never done   Lab Results    Latest Ref Rng & Units 11/04/2023    5:28 PM  CBC  WBC 4.0 - 10.5 K/uL 6.7   Hemoglobin 13.0 - 17.0 g/dL 85.0   Hematocrit 60.9 - 52.0 % 44.6   Platelets 150 - 400 K/uL 169       Latest Ref Rng & Units 11/04/2023    5:28 PM  CMP  Glucose 70 - 99 mg/dL 92   BUN  6 - 20 mg/dL 22   Creatinine 9.38 - 1.24 mg/dL 9.13   Sodium 864 - 854 mmol/L 132   Potassium 3.5 - 5.1 mmol/L 3.6   Chloride 98 - 111 mmol/L 102   CO2 22 - 32 mmol/L 24   Calcium 8.9 - 10.3 mg/dL 8.7      Assessment & Plan:   Problem List Items Addressed This Visit       Musculoskeletal and Integument   Internal derangement of right knee   History and exam consistent with a prior meniscal tear. Currently this is not very bothersome. Recommend expectant management for now.        Other   Annual physical exam - Primary   Overall health is very good. Recommend ongoing regular exercise. Discussed recommended screenings and immunizations.       Other Visit Diagnoses       Screening for lipid disorders       Relevant Orders   Lipid panel     Need for Tdap vaccination       Relevant Orders   Tdap vaccine greater than or equal to 7yo IM (Completed)      Return in about 1 year (around 10/17/2025) for Annual preventative care.   Garnette CHRISTELLA Simpler, MD  I,Emily Lagle,acting as a scribe for Garnette CHRISTELLA Simpler, MD.,have documented all relevant documentation on the behalf of Garnette CHRISTELLA Simpler, MD.  I, Garnette CHRISTELLA Simpler, MD, have reviewed all documentation for this visit. The documentation on 10/17/2024 for the exam, diagnosis, procedures, and orders are all accurate and complete.

## 2024-10-17 NOTE — Assessment & Plan Note (Signed)
 Overall health is very good. Recommend ongoing regular exercise. Discussed recommended screenings and immunizations.
